# Patient Record
Sex: Female | Born: 1960 | Race: Black or African American | Hispanic: No | Marital: Single | Smoking: Never smoker
Health system: Southern US, Community
[De-identification: ages and names within clinical notes are randomized; demographics above are authoritative.]

## PROBLEM LIST (undated history)

## (undated) DIAGNOSIS — E7800 Pure hypercholesterolemia, unspecified: Secondary | ICD-10-CM

## (undated) DIAGNOSIS — I10 Essential (primary) hypertension: Secondary | ICD-10-CM

## (undated) HISTORY — PX: OTHER SURGICAL HISTORY: SHX169

---

## 2001-02-04 ENCOUNTER — Ambulatory Visit (HOSPITAL_COMMUNITY): Admission: RE | Admit: 2001-02-04 | Discharge: 2001-02-04 | Payer: Self-pay | Admitting: *Deleted

## 2001-02-04 ENCOUNTER — Encounter: Payer: Self-pay | Admitting: *Deleted

## 2001-02-04 ENCOUNTER — Other Ambulatory Visit: Admission: RE | Admit: 2001-02-04 | Discharge: 2001-02-04 | Payer: Self-pay | Admitting: *Deleted

## 2002-04-21 ENCOUNTER — Encounter: Payer: Self-pay | Admitting: Family Medicine

## 2002-04-21 ENCOUNTER — Ambulatory Visit (HOSPITAL_COMMUNITY): Admission: RE | Admit: 2002-04-21 | Discharge: 2002-04-21 | Payer: Self-pay | Admitting: Family Medicine

## 2002-08-04 ENCOUNTER — Emergency Department (HOSPITAL_COMMUNITY): Admission: EM | Admit: 2002-08-04 | Discharge: 2002-08-04 | Payer: Self-pay | Admitting: Emergency Medicine

## 2003-06-25 ENCOUNTER — Ambulatory Visit (HOSPITAL_COMMUNITY): Admission: RE | Admit: 2003-06-25 | Discharge: 2003-06-25 | Payer: Self-pay | Admitting: Family Medicine

## 2004-07-03 ENCOUNTER — Ambulatory Visit (HOSPITAL_COMMUNITY): Admission: RE | Admit: 2004-07-03 | Discharge: 2004-07-03 | Payer: Self-pay | Admitting: Family Medicine

## 2005-03-09 ENCOUNTER — Emergency Department (HOSPITAL_COMMUNITY): Admission: EM | Admit: 2005-03-09 | Discharge: 2005-03-09 | Payer: Self-pay | Admitting: Emergency Medicine

## 2005-07-04 ENCOUNTER — Ambulatory Visit (HOSPITAL_COMMUNITY): Admission: RE | Admit: 2005-07-04 | Discharge: 2005-07-04 | Payer: Self-pay | Admitting: Family Medicine

## 2006-07-11 ENCOUNTER — Ambulatory Visit (HOSPITAL_COMMUNITY): Admission: RE | Admit: 2006-07-11 | Discharge: 2006-07-11 | Payer: Self-pay | Admitting: Family Medicine

## 2007-07-15 ENCOUNTER — Ambulatory Visit (HOSPITAL_COMMUNITY): Admission: RE | Admit: 2007-07-15 | Discharge: 2007-07-15 | Payer: Self-pay | Admitting: Family Medicine

## 2008-07-14 ENCOUNTER — Ambulatory Visit (HOSPITAL_COMMUNITY): Admission: RE | Admit: 2008-07-14 | Discharge: 2008-07-14 | Payer: Self-pay | Admitting: Internal Medicine

## 2009-03-15 ENCOUNTER — Other Ambulatory Visit: Admission: RE | Admit: 2009-03-15 | Discharge: 2009-03-15 | Payer: Self-pay | Admitting: Obstetrics & Gynecology

## 2009-07-22 ENCOUNTER — Ambulatory Visit (HOSPITAL_COMMUNITY): Admission: RE | Admit: 2009-07-22 | Discharge: 2009-07-22 | Payer: Self-pay | Admitting: Family Medicine

## 2009-08-31 ENCOUNTER — Ambulatory Visit (HOSPITAL_COMMUNITY): Payer: Self-pay | Admitting: Oncology

## 2009-08-31 ENCOUNTER — Encounter (HOSPITAL_COMMUNITY)
Admission: RE | Admit: 2009-08-31 | Discharge: 2009-09-30 | Payer: Self-pay | Source: Home / Self Care | Admitting: Oncology

## 2010-05-03 ENCOUNTER — Encounter (HOSPITAL_COMMUNITY): Admission: RE | Admit: 2010-05-03 | Payer: Self-pay | Admitting: Oncology

## 2010-08-06 ENCOUNTER — Encounter: Payer: Self-pay | Admitting: Family Medicine

## 2010-08-10 ENCOUNTER — Other Ambulatory Visit (HOSPITAL_COMMUNITY): Payer: Self-pay | Admitting: Family Medicine

## 2010-08-10 DIAGNOSIS — Z139 Encounter for screening, unspecified: Secondary | ICD-10-CM

## 2010-08-14 ENCOUNTER — Ambulatory Visit (HOSPITAL_COMMUNITY)
Admission: RE | Admit: 2010-08-14 | Discharge: 2010-08-14 | Payer: Self-pay | Source: Home / Self Care | Attending: Family Medicine | Admitting: Family Medicine

## 2010-10-04 LAB — DIFFERENTIAL
Basophils Relative: 1 % (ref 0–1)
Eosinophils Absolute: 0 10*3/uL (ref 0.0–0.7)
Eosinophils Relative: 1 % (ref 0–5)
Monocytes Absolute: 0.5 10*3/uL (ref 0.1–1.0)
Monocytes Relative: 11 % (ref 3–12)
Neutro Abs: 2.4 10*3/uL (ref 1.7–7.7)
Neutrophils Relative %: 49 % (ref 43–77)

## 2010-10-04 LAB — CBC
MCV: 91.7 fL (ref 78.0–100.0)
RBC: 4.17 MIL/uL (ref 3.87–5.11)
RDW: 15.1 % (ref 11.5–15.5)
WBC: 4.9 10*3/uL (ref 4.0–10.5)

## 2011-01-30 ENCOUNTER — Telehealth: Payer: Self-pay | Admitting: Family Medicine

## 2011-01-30 NOTE — Telephone Encounter (Signed)
Sheralyn Boatman from [facility] scheduling called to say that the order you put in is not the order that they need for the hepatotbiliary scan. She put the correct order in epic for you and just needs [place] to cancel the one you put in. They are always done with ejection fraction to eval the GB. Please cancel the order you put in. Thanks.

## 2011-01-30 NOTE — Telephone Encounter (Signed)
It appears as if this was already cancelled when i review chart.

## 2011-08-16 ENCOUNTER — Ambulatory Visit (HOSPITAL_COMMUNITY)
Admission: RE | Admit: 2011-08-16 | Discharge: 2011-08-16 | Disposition: A | Discharge status: Home / Self Care | Payer: BC Managed Care – PPO | Source: Ambulatory Visit | Attending: Family Medicine | Admitting: Family Medicine

## 2011-08-16 DIAGNOSIS — Z139 Encounter for screening, unspecified: Secondary | ICD-10-CM

## 2011-08-16 DIAGNOSIS — Z1231 Encounter for screening mammogram for malignant neoplasm of breast: Secondary | ICD-10-CM | POA: Insufficient documentation

## 2012-04-04 NOTE — H&P (Signed)
  NTS SOAP Note  Vital Signs:  Vitals as of: 03/25/2012: Systolic 149: Diastolic 87: Heart Rate 69: Temp 97.41F: Height 36ft 8in: Weight 207Lbs 0 Ounces: BMI 31  BMI : 31.47 kg/m2  Subjective: This 51 Years 44 Months old Female presents for screening TCS.  Never has had one.  Mother and brother both have colon cancer.  Denies any gi complaints.  Review of Symptoms:  Constitutional:unremarkable   Head:unremarkable    Eyes:unremarkable   Nose/Mouth/Throat:unremarkable Cardiovascular:  unremarkable   Respiratory:unremarkable   Gastrointestinal:  unremarkable   Genitourinary:unremarkable     Musculoskeletal:unremarkable   Skin:unremarkable Hematolgic/Lymphatic:unremarkable     Allergic/Immunologic:unremarkable     Past Medical History:    Reviewed   Past Medical History  Surgical History: none Medical Problems:  High Blood pressure, High cholesterol Allergies: nkda Medications: unknown   Social History:Reviewed  Social History  Preferred Language: English ([place]) Race:  Black or African American Ethnicity: Not Hispanic / Latino Age: 51 Years 8 Months Marital Status:  S Alcohol:  No Recreational drug(s):  No   Smoking Status: Never smoker reviewed on 03/25/2012  Family History:  Reviewed   Family History  Is there a family history ON:GEXBMW h/o colon cancer in mother and brother    Objective Information: General:  Well appearing, well nourished in no distress. Head:Atraumatic; no masses; no abnormalities Heart:  RRR, no murmur Lungs:    CTA bilaterally, no wheezes, rhonchi, rales.  Breathing unlabored. Abdomen:Soft, NT/ND, no HSM, no masses.   deferred to procedure  Assessment:Need for screening TCS, high risk  Diagnosis &amp; Procedure:    Plan:Will call to schedule TCS.   Patient Education:Alternative treatments to surgery were discussed with patient (and family).  Risks  and benefits  of procedure were fully explained to the patient (and family) who gave informed consent. Patient/family questions were addressed.  Follow-up:Pending Surgery

## 2012-04-09 ENCOUNTER — Encounter (HOSPITAL_COMMUNITY): Payer: Self-pay | Admitting: Pharmacy Technician

## 2012-04-14 MED ORDER — SODIUM CHLORIDE 0.45 % IV SOLN
INTRAVENOUS | Status: DC
  Administered 2012-04-15: 08:00:00 via INTRAVENOUS

## 2012-04-14 MED ORDER — SODIUM CHLORIDE 0.9 % IV SOLN: INTRAVENOUS | Status: DC

## 2012-04-15 ENCOUNTER — Ambulatory Visit (HOSPITAL_COMMUNITY)
Admission: RE | Admit: 2012-04-15 | Discharge: 2012-04-15 | Disposition: A | Discharge status: Home / Self Care | Payer: BC Managed Care – PPO | Source: Ambulatory Visit | Attending: General Surgery | Admitting: General Surgery

## 2012-04-15 ENCOUNTER — Encounter (HOSPITAL_COMMUNITY)
Admission: RE | Disposition: A | Discharge status: Home / Self Care | Payer: Self-pay | Source: Ambulatory Visit | Attending: General Surgery

## 2012-04-15 ENCOUNTER — Encounter (HOSPITAL_COMMUNITY): Payer: Self-pay | Admitting: *Deleted

## 2012-04-15 DIAGNOSIS — Z1211 Encounter for screening for malignant neoplasm of colon: Secondary | ICD-10-CM | POA: Insufficient documentation

## 2012-04-15 DIAGNOSIS — E7800 Pure hypercholesterolemia, unspecified: Secondary | ICD-10-CM | POA: Insufficient documentation

## 2012-04-15 DIAGNOSIS — I10 Essential (primary) hypertension: Secondary | ICD-10-CM | POA: Insufficient documentation

## 2012-04-15 DIAGNOSIS — Z01812 Encounter for preprocedural laboratory examination: Secondary | ICD-10-CM | POA: Insufficient documentation

## 2012-04-15 HISTORY — PX: COLONOSCOPY: SHX5424

## 2012-04-15 HISTORY — DX: Essential (primary) hypertension: I10

## 2012-04-15 HISTORY — DX: Pure hypercholesterolemia, unspecified: E78.00

## 2012-04-15 SURGERY — COLONOSCOPY
Anesthesia: Moderate Sedation

## 2012-04-15 MED ORDER — STERILE WATER FOR IRRIGATION IR SOLN
Status: DC | PRN
  Administered 2012-04-15: 08:00:00

## 2012-04-15 MED ORDER — MEPERIDINE HCL 50 MG/ML IJ SOLN
INTRAMUSCULAR | Status: AC
  Filled 2012-04-15: qty 2

## 2012-04-15 MED ORDER — MIDAZOLAM HCL 5 MG/5ML IJ SOLN
INTRAMUSCULAR | Status: DC | PRN
  Administered 2012-04-15: 3 mg via INTRAVENOUS

## 2012-04-15 MED ORDER — MEPERIDINE HCL 25 MG/ML IJ SOLN
INTRAMUSCULAR | Status: DC | PRN
  Administered 2012-04-15: 50 mg via INTRAVENOUS

## 2012-04-15 MED ORDER — MIDAZOLAM HCL 5 MG/5ML IJ SOLN
INTRAMUSCULAR | Status: AC
  Filled 2012-04-15: qty 10

## 2012-04-15 NOTE — Op Note (Signed)
[facility] [address] [place], [address]   COLONOSCOPY PROCEDURE REPORT  PATIENT: Penny Saunders, Penny Saunders.  MR#: [id number] BIRTHDATE: Jul 31, 1960 , 50  yrs. old GENDER: Female ENDOSCOPIST: Franky Macho, MD REFERRED NW:GNFAOZH, John PROCEDURE DATE:  04/15/2012 PROCEDURE:   Colonoscopy, screening ASA CLASS:   Class II INDICATIONS:patient's immediate family history of colon cancer. MEDICATIONS: Versed 3 mg IV and Demerol 50 mg IV  DESCRIPTION OF PROCEDURE:   After the risks benefits and alternatives of the procedure were thoroughly explained, informed consent was obtained.  A digital rectal exam revealed no abnormalities of the rectum.   The EC-[id number] ([id number])  endoscope was introduced through the anus and advanced to the cecum, which was identified by both the appendix and ileocecal valve. No adverse events experienced.   The quality of the prep was adequate, using MoviPrep  The instrument was then slowly withdrawn as the colon was fully examined.      COLON FINDINGS: A normal appearing cecum, ileocecal valve, and appendiceal orifice were identified.  The ascending, hepatic flexure, transverse, splenic flexure, descending, sigmoid colon and rectum appeared unremarkable.  No polyps or cancers were seen. Retroflexion was not performed due to a narrow rectal vault. The time to cecum=4 minutes 0 seconds.  Withdrawal time=3 minutes 0 seconds.  The scope was withdrawn and the procedure completed. COMPLICATIONS: There were no complications.  ENDOSCOPIC IMPRESSION: Normal colon  RECOMMENDATIONS: repeat Colonoscopy in 5 years.   eSigned:  Franky Macho, MD 04/15/2012 9:08 AM   cc:

## 2012-04-15 NOTE — Interval H&P Note (Signed)
History and Physical Interval Note:  04/15/2012 8:51 AM  Penny Saunders  has presented today for surgery, with the diagnosis of Family history of colon cancer   The various methods of treatment have been discussed with the patient and family. After consideration of risks, benefits and other options for treatment, the patient has consented to  Procedure(s) (LRB) with comments: COLONOSCOPY (N/A) as a surgical intervention .  The patient's history has been reviewed, patient examined, no change in status, stable for surgery.  I have reviewed the patient's chart and labs.  Questions were answered to the patient's satisfaction.     Franky Macho A

## 2012-04-21 ENCOUNTER — Encounter (HOSPITAL_COMMUNITY): Payer: Self-pay | Admitting: General Surgery

## 2012-08-27 ENCOUNTER — Other Ambulatory Visit (HOSPITAL_COMMUNITY): Payer: Self-pay | Admitting: Family Medicine

## 2012-08-27 DIAGNOSIS — Z139 Encounter for screening, unspecified: Secondary | ICD-10-CM

## 2012-09-01 ENCOUNTER — Ambulatory Visit (HOSPITAL_COMMUNITY): Payer: BC Managed Care – PPO

## 2012-09-01 ENCOUNTER — Ambulatory Visit (HOSPITAL_COMMUNITY)
Admission: RE | Admit: 2012-09-01 | Discharge: 2012-09-01 | Disposition: A | Discharge status: Home / Self Care | Payer: BC Managed Care – PPO | Source: Ambulatory Visit | Attending: Family Medicine | Admitting: Family Medicine

## 2012-09-01 DIAGNOSIS — Z139 Encounter for screening, unspecified: Secondary | ICD-10-CM

## 2012-09-01 DIAGNOSIS — Z1231 Encounter for screening mammogram for malignant neoplasm of breast: Secondary | ICD-10-CM | POA: Insufficient documentation

## 2013-10-29 ENCOUNTER — Other Ambulatory Visit (HOSPITAL_COMMUNITY): Payer: Self-pay | Admitting: Family Medicine

## 2013-10-29 DIAGNOSIS — Z139 Encounter for screening, unspecified: Secondary | ICD-10-CM

## 2013-11-03 ENCOUNTER — Ambulatory Visit (HOSPITAL_COMMUNITY)
Admission: RE | Admit: 2013-11-03 | Discharge: 2013-11-03 | Disposition: A | Discharge status: Home / Self Care | Payer: BC Managed Care – PPO | Source: Ambulatory Visit | Attending: Family Medicine | Admitting: Family Medicine

## 2013-11-03 DIAGNOSIS — Z1211 Encounter for screening for malignant neoplasm of colon: Secondary | ICD-10-CM | POA: Insufficient documentation

## 2013-11-03 DIAGNOSIS — Z139 Encounter for screening, unspecified: Secondary | ICD-10-CM

## 2014-01-14 ENCOUNTER — Other Ambulatory Visit (HOSPITAL_COMMUNITY): Payer: Self-pay | Admitting: Family Medicine

## 2014-01-14 DIAGNOSIS — R946 Abnormal results of thyroid function studies: Secondary | ICD-10-CM

## 2014-01-18 ENCOUNTER — Ambulatory Visit (HOSPITAL_COMMUNITY): Admission: RE | Admit: 2014-01-18 | Payer: BC Managed Care – PPO | Source: Ambulatory Visit

## 2014-01-25 ENCOUNTER — Encounter (HOSPITAL_COMMUNITY): Payer: Self-pay | Admitting: Emergency Medicine

## 2014-01-25 ENCOUNTER — Emergency Department (HOSPITAL_COMMUNITY)
Admission: EM | Admit: 2014-01-25 | Discharge: 2014-01-25 | Disposition: A | Discharge status: Home / Self Care | Payer: BC Managed Care – PPO | Attending: Emergency Medicine | Admitting: Emergency Medicine

## 2014-01-25 ENCOUNTER — Ambulatory Visit (HOSPITAL_COMMUNITY)
Admission: RE | Admit: 2014-01-25 | Discharge: 2014-01-25 | Disposition: A | Discharge status: Home / Self Care | Payer: BC Managed Care – PPO | Source: Ambulatory Visit | Attending: Family Medicine | Admitting: Family Medicine

## 2014-01-25 ENCOUNTER — Emergency Department (HOSPITAL_COMMUNITY): Payer: BC Managed Care – PPO

## 2014-01-25 DIAGNOSIS — H8110 Benign paroxysmal vertigo, unspecified ear: Secondary | ICD-10-CM | POA: Insufficient documentation

## 2014-01-25 DIAGNOSIS — Z79899 Other long term (current) drug therapy: Secondary | ICD-10-CM | POA: Insufficient documentation

## 2014-01-25 DIAGNOSIS — Z7982 Long term (current) use of aspirin: Secondary | ICD-10-CM | POA: Insufficient documentation

## 2014-01-25 DIAGNOSIS — E042 Nontoxic multinodular goiter: Secondary | ICD-10-CM | POA: Insufficient documentation

## 2014-01-25 DIAGNOSIS — I10 Essential (primary) hypertension: Secondary | ICD-10-CM | POA: Insufficient documentation

## 2014-01-25 DIAGNOSIS — E7800 Pure hypercholesterolemia, unspecified: Secondary | ICD-10-CM | POA: Insufficient documentation

## 2014-01-25 DIAGNOSIS — R946 Abnormal results of thyroid function studies: Secondary | ICD-10-CM

## 2014-01-25 DIAGNOSIS — E119 Type 2 diabetes mellitus without complications: Secondary | ICD-10-CM | POA: Insufficient documentation

## 2014-01-25 LAB — CBC WITH DIFFERENTIAL/PLATELET
BASOS ABS: 0 10*3/uL (ref 0.0–0.1)
BASOS PCT: 0 % (ref 0–1)
EOS ABS: 0 10*3/uL (ref 0.0–0.7)
Eosinophils Relative: 1 % (ref 0–5)
HCT: 37.5 % (ref 36.0–46.0)
Hemoglobin: 12 g/dL (ref 12.0–15.0)
Lymphocytes Relative: 28 % (ref 12–46)
Lymphs Abs: 1.6 10*3/uL (ref 0.7–4.0)
MCH: 28.4 pg (ref 26.0–34.0)
MCHC: 32 g/dL (ref 30.0–36.0)
MCV: 88.9 fL (ref 78.0–100.0)
Monocytes Absolute: 0.3 10*3/uL (ref 0.1–1.0)
Monocytes Relative: 5 % (ref 3–12)
NEUTROS PCT: 66 % (ref 43–77)
Neutro Abs: 3.9 10*3/uL (ref 1.7–7.7)
PLATELETS: 384 10*3/uL (ref 150–400)
RBC: 4.22 MIL/uL (ref 3.87–5.11)
RDW: 14.4 % (ref 11.5–15.5)
WBC: 5.9 10*3/uL (ref 4.0–10.5)

## 2014-01-25 LAB — BASIC METABOLIC PANEL
Anion gap: 11 (ref 5–15)
BUN: 13 mg/dL (ref 6–23)
CO2: 25 mEq/L (ref 19–32)
Calcium: 9.6 mg/dL (ref 8.4–10.5)
Chloride: 107 mEq/L (ref 96–112)
Creatinine, Ser: 0.49 mg/dL — ABNORMAL LOW (ref 0.50–1.10)
Glucose, Bld: 141 mg/dL — ABNORMAL HIGH (ref 70–99)
POTASSIUM: 3.8 meq/L (ref 3.7–5.3)
SODIUM: 143 meq/L (ref 137–147)

## 2014-01-25 LAB — CBG MONITORING, ED: GLUCOSE-CAPILLARY: 148 mg/dL — AB (ref 70–99)

## 2014-01-25 LAB — URINALYSIS, ROUTINE W REFLEX MICROSCOPIC
BILIRUBIN URINE: NEGATIVE
Glucose, UA: NEGATIVE mg/dL
Leukocytes, UA: NEGATIVE
NITRITE: NEGATIVE
UROBILINOGEN UA: 1 mg/dL (ref 0.0–1.0)
pH: 5.5 (ref 5.0–8.0)

## 2014-01-25 LAB — URINE MICROSCOPIC-ADD ON

## 2014-01-25 MED ORDER — MECLIZINE HCL 12.5 MG PO TABS
25.0000 mg | ORAL_TABLET | Freq: Once | ORAL | Status: AC
  Administered 2014-01-25: 25 mg via ORAL
  Filled 2014-01-25: qty 2

## 2014-01-25 MED ORDER — MECLIZINE HCL 50 MG PO TABS: 50.0000 mg | ORAL_TABLET | Freq: Three times a day (TID) | ORAL | Status: DC | PRN

## 2014-01-25 NOTE — Discharge Instructions (Signed)
The MR of your brain is negative for stroke or bleed, mass or tumor. Your complete blood count and your basic metabolic chemistries are within normal limits with exception of your glucose being slightly elevated at 148. Suspect that your dizziness/lightheadedness is related to an inner ear problem. Please increase your water and fluids. Please change positions slowly. Please use Antivert every 6 hours or 3 times daily to assist with the lightheadedness. This medication may cause drowsiness, please use caution in changing positions or getting up and down. Please see your primary physician for additional followup, return to the emergency department if any emergent changes, problems, or concerns. Benign Positional Vertigo Vertigo means you feel like you or your surroundings are moving when they are not. Benign positional vertigo is the most common form of vertigo. Benign means that the cause of your condition is not serious. Benign positional vertigo is more common in older adults. CAUSES  Benign positional vertigo is the result of an upset in the labyrinth system. This is an area in the middle ear that helps control your balance. This may be caused by a viral infection, head injury, or repetitive motion. However, often no specific cause is found. SYMPTOMS  Symptoms of benign positional vertigo occur when you move your head or eyes in different directions. Some of the symptoms may include:  Loss of balance and falls.  Vomiting.  Blurred vision.  Dizziness.  Nausea.  Involuntary eye movements (nystagmus). DIAGNOSIS  Benign positional vertigo is usually diagnosed by physical exam. If the specific cause of your benign positional vertigo is unknown, your caregiver may perform imaging tests, such as magnetic resonance imaging (MRI) or computed tomography (CT). TREATMENT  Your caregiver may recommend movements or procedures to correct the benign positional vertigo. Medicines such as meclizine,  benzodiazepines, and medicines for nausea may be used to treat your symptoms. In rare cases, if your symptoms are caused by certain conditions that affect the inner ear, you may need surgery. HOME CARE INSTRUCTIONS   Follow your caregiver's instructions.  Move slowly. Do not make sudden body or head movements.  Avoid driving.  Avoid operating heavy machinery.  Avoid performing any tasks that would be dangerous to you or others during a vertigo episode.  Drink enough fluids to keep your urine clear or pale yellow. SEEK IMMEDIATE MEDICAL CARE IF:   You develop problems with walking, weakness, numbness, or using your arms, hands, or legs.  You have difficulty speaking.  You develop severe headaches.  Your nausea or vomiting continues or gets worse.  You develop visual changes.  Your family or friends notice any behavioral changes.  Your condition gets worse.  You have a fever.  You develop a stiff neck or sensitivity to light. MAKE SURE YOU:   Understand these instructions.  Will watch your condition.  Will get help right away if you are not doing well or get worse. Document Released: 04/09/2006 Document Revised: 09/24/2011 Document Reviewed: 03/22/2011 [facility] Patient Information 2015 [place]Care, [place]. This information is not intended to replace advice given to you by your health care provider. Make sure you discuss any questions you have with your health care provider.

## 2014-01-25 NOTE — ED Provider Notes (Signed)
CSN: [id number]     Arrival date & time 01/25/14  0804 History   First MD Initiated Contact with Patient 01/25/14 [phone]     No chief complaint on file.    (Consider location/radiation/quality/duration/timing/severity/associated sxs/prior Treatment) HPI Comments: Pt is a 53 y/o female who presents to the ED with c/o "being off balance and dizzy". This problem started yesterday. Pt felt ok at work yesterday, but noted mild dizziness last night at bed time. Problem worse this AM. States she did not feel she could drive. She describes the dizziness as being off balance and feeling as if she may fall. She checked her blood sugars. They were 127 to 140. She denies missing any diabetic medication. No hx of injury or trauma. No sinus congestion. She had problem with dizziness with her blood sugar was low a year ago, no problem since that time.  The history is provided by the patient.    Past Medical History  Diagnosis Date  . Diabetes mellitus   . Hypertension   . Hypercholesteremia    Past Surgical History  Procedure Laterality Date  . Left breast cystectomy    . Colonoscopy  04/15/2012    Procedure: COLONOSCOPY;  Surgeon: Dalia HeadingMark A Jenkins, MD;  Location: [facility];  Service: Gastroenterology;  Laterality: N/A;   Family History  Problem Relation Age of Onset  . Colon cancer Mother    History  Substance Use Topics  . Smoking status: Never Smoker   . Smokeless tobacco: Not on file  . Alcohol Use: No   OB History   Grav Para Term Preterm Abortions TAB SAB Ect Mult Living                 Review of Systems  Constitutional: Negative for activity change.       All ROS Neg except as noted in HPI  HENT: Negative for congestion, ear pain, hearing loss, postnasal drip and sinus pressure.   Eyes: Negative for photophobia and discharge.  Respiratory: Negative for cough, shortness of breath and wheezing.   Cardiovascular: Negative for chest pain and palpitations.  Gastrointestinal: Negative  for abdominal pain and blood in stool.  Genitourinary: Negative for dysuria, frequency and hematuria.  Musculoskeletal: Negative for arthralgias, back pain and neck pain.  Skin: Negative.   Neurological: Positive for dizziness and light-headedness. Negative for seizures and speech difficulty.  Psychiatric/Behavioral: Negative for hallucinations and confusion.      Allergies  Review of patient's allergies indicates no known allergies.  Home Medications   Prior to Admission medications   Medication Sig Start Date End Date Taking? Authorizing Provider  Aspirin (ANACIN PO) Take 2 tablets by mouth 2 (two) times daily as needed. Pain.    Historical Provider, MD  pioglitazone (ACTOS) 30 MG tablet Take 30 mg by mouth daily.    Historical Provider, MD  ramipril (ALTACE) 10 MG capsule Take 10 mg by mouth daily.    Historical Provider, MD  simvastatin (ZOCOR) 40 MG tablet Take 40 mg by mouth every evening.    Historical Provider, MD  sitaGLIPtan-metformin (JANUMET) 50-1000 MG per tablet Take 1 tablet by mouth 2 (two) times daily with a meal.    Historical Provider, MD   LMP 08/01/2012 Physical Exam  Nursing note and vitals reviewed. Constitutional: She is oriented to person, place, and time. She appears well-developed and well-nourished.  Non-toxic appearance.  HENT:  Head: Normocephalic.  Right Ear: Tympanic membrane and external ear normal.  Left Ear: Tympanic membrane and  external ear normal.  Eyes: EOM and lids are normal. Pupils are equal, round, and reactive to light.  No nystagmus  Neck: Normal range of motion. Neck supple. Carotid bruit is not present.  No carotid bruits  Cardiovascular: Normal rate, regular rhythm, normal heart sounds, intact distal pulses and normal pulses.   Pulmonary/Chest: Breath sounds normal. No respiratory distress.  Abdominal: Soft. Bowel sounds are normal. There is no tenderness. There is no guarding.  Musculoskeletal: Normal range of motion.   Lymphadenopathy:       Head (right side): No submandibular adenopathy present.       Head (left side): No submandibular adenopathy present.    She has no cervical adenopathy.  Neurological: She is alert and oriented to person, place, and time. She has normal strength. No cranial nerve deficit or sensory deficit.  I could not recreate sensation of dizziness with change of position or even a short walk. However patient walks in the hallway she has abnormal gait and complained of being off balance and feeling as if she will fall.  Skin: Skin is warm and dry.  Psychiatric: She has a normal mood and affect. Her speech is normal and behavior is normal.    ED Course  Procedures (including critical care time) Labs Review Labs Reviewed - No data to display  Imaging Review No results found.   EKG Interpretation None      MDM The vital signs are well within normal limits. There is no evidence for orthostatic blood pressure changes. Complete blood count is well within normal limits. Basic metabolic panel is within normal limits with exception of the glucose being 141.  MR of the brain was read as normal by the radiologist. Suspect benign paroxysmal positional vertigo. Patient is to follow up with primary physician, or return to the emergency department if any changes, problems, or concerns. Prescription for Antivert was given to the patient with caution and  instructions.    Final diagnoses:  None    **I have reviewed nursing notes, vital signs, and all appropriate lab and imaging results for this patient.Kathie Dike, PA-C 01/25/14 1047

## 2014-01-25 NOTE — ED Notes (Signed)
Lab at bedside

## 2014-01-25 NOTE — ED Notes (Signed)
POC CBG resulted with a value of 148.

## 2014-01-25 NOTE — ED Provider Notes (Signed)
  Face-to-face evaluation   History: She complains of dizziness, gradual in onset, worsening, since yesterday. She denies headache, chest pain, fever, chills, sinus congestion, cough, or shortness of breath. She's never had this previously. There's been no head trauma.  Physical exam: Alert, cooperative. Eyes, ears, and nose and mouth are normal. Heart regular rate and rhythm. No murmur. Lungs clear to auscultation. Neurologic alert and oriented x3 and affect is appropriate. There is no nystagmus, dysarthria or aphasia. Negative head impulse testing, no inducible nystagmus. Gait is ataxic.  Neck- no meningismus.   Medical screening examination/treatment/procedure(s) were conducted as a shared visit with non-physician practitioner(s) and myself.  I personally evaluated the patient during the encounter  Flint MelterElliott L Gwenivere Hiraldo, MD 01/25/14 1705

## 2014-01-25 NOTE — ED Notes (Signed)
Pt not able to void at this time. Pt reports voided prior to arrival. Will attempt to obtain urine sample at later time. nad noted.

## 2014-01-25 NOTE — ED Notes (Signed)
Pt reports dizziness that started yesterday per pt. Pt reports unable to keep balance when walking. Grips equals. Face symmetrical. A/o x3.

## 2014-05-20 ENCOUNTER — Other Ambulatory Visit (HOSPITAL_COMMUNITY): Payer: Self-pay | Admitting: "Endocrinology

## 2014-05-20 DIAGNOSIS — E041 Nontoxic single thyroid nodule: Secondary | ICD-10-CM

## 2014-06-02 ENCOUNTER — Other Ambulatory Visit (HOSPITAL_COMMUNITY): Payer: Self-pay | Admitting: "Endocrinology

## 2014-06-02 ENCOUNTER — Ambulatory Visit (HOSPITAL_COMMUNITY)
Admission: RE | Admit: 2014-06-02 | Discharge: 2014-06-02 | Disposition: A | Discharge status: Home / Self Care | Payer: BC Managed Care – PPO | Source: Ambulatory Visit | Attending: "Endocrinology | Admitting: "Endocrinology

## 2014-06-02 ENCOUNTER — Encounter (HOSPITAL_COMMUNITY): Payer: Self-pay

## 2014-06-02 DIAGNOSIS — E042 Nontoxic multinodular goiter: Secondary | ICD-10-CM | POA: Diagnosis not present

## 2014-06-02 DIAGNOSIS — E041 Nontoxic single thyroid nodule: Secondary | ICD-10-CM

## 2014-06-02 MED ORDER — LIDOCAINE HCL (PF) 2 % IJ SOLN
INTRAMUSCULAR | Status: AC
  Administered 2014-06-02: 200 mg
  Filled 2014-06-02: qty 10

## 2014-06-02 NOTE — Discharge Instructions (Signed)
Thyroid Biopsy °The thyroid gland is a butterfly-shaped gland situated in the front of the neck. It produces hormones which affect metabolism, growth and development, and body temperature. A thyroid biopsy is a procedure in which small samples of tissue or fluid are removed from the thyroid gland or mass and examined under a microscope. This test is done to determine the cause of thyroid problems, such as infection, cancer, or other thyroid problems. °There are 2 ways to obtain samples: °1. Fine needle biopsy. Samples are removed using a thin needle inserted through the skin and into the thyroid gland or mass. °2. Open biopsy. Samples are removed after a cut (incision) is made through the skin. °LET YOUR CAREGIVER KNOW ABOUT:  °· Allergies. °· Medications taken including herbs, eye drops, over-the-counter medications, and creams. °· Use of steroids (by mouth or creams). °· Previous problems with anesthetics or numbing medicine. °· Possibility of pregnancy, if this applies. °· History of blood clots (thrombophlebitis). °· History of bleeding or blood problems. °· Previous surgery. °· Other health problems. °RISKS AND COMPLICATIONS °· Bleeding from the site. The risk of bleeding is higher if you have a bleeding disorder or are taking any blood thinning medications (anticoagulants). °· Infection. °· Injury to structures near the thyroid gland. °BEFORE THE PROCEDURE  °This is a procedure that can be done as an outpatient. Confirm the time that you need to arrive for your procedure. Confirm whether there is a need to fast or withhold any medications. A blood sample may be done to determine your blood clotting time. Medicine may be given to help you relax (sedative). °PROCEDURE °Fine needle biopsy. °You will be awake during the procedure. You may be asked to lie on your back with your head tipped backward to extend your neck. Let your caregiver know if you cannot tolerate the positioning. An area on your neck will be  cleansed. A needle is inserted through the skin of your neck. You may feel a mild discomfort during this procedure. You may be asked to avoid coughing, talking, swallowing, or making sounds during some portions of the procedure. The needle is withdrawn once tissue or fluid samples have been removed. Pressure may be applied to the neck to reduce swelling and ensure that bleeding has stopped. The samples will be sent for examination.  °Open biopsy. °You will be given general anesthesia. You will be asleep during the procedure. An incision is made in your neck. A sample of thyroid tissue or the mass is removed. The tissue sample or mass will be sent for examination. The sample or mass may be examined during the biopsy. If the sample or mass contains cancer cells, some or all of the thyroid gland may be removed. The incision is closed with stitches. °AFTER THE PROCEDURE  °Your recovery will be assessed and monitored. If there are no problems, as an outpatient, you should be able to go home shortly after the procedure. °If you had a fine needle biopsy: °· You may have soreness at the biopsy site for 1 to 2 days. °If you had an open biopsy:  °· You may have soreness at the biopsy site for 3 to 4 days. °· You may have a hoarse voice or sore throat for 1 to 2 days. °Obtaining the Test Results °It is your responsibility to obtain your test results. Do not assume everything is normal if you have not heard from your caregiver or the medical facility. It is important for you to follow up   on all of your test results. °HOME CARE INSTRUCTIONS  °· Keeping your head raised on a pillow when you are lying down may ease biopsy site discomfort. °· Supporting the back of your head and neck with both hands as you sit up from a lying position may ease biopsy site discomfort. °· Only take over-the-counter or prescription medicines for pain, discomfort, or fever as directed by your caregiver. °· Throat lozenges or gargling with warm salt  water may help to soothe a sore throat. °SEEK IMMEDIATE MEDICAL CARE IF:  °· You have severe bleeding from the biopsy site. °· You have difficulty swallowing. °· You have a fever. °· You have increased pain, swelling, redness, or warmth at the biopsy site. °· You notice pus coming from the biopsy site. °· You have swollen glands (lymph nodes) in your neck. °Document Released: 04/29/2007 Document Revised: 10/27/2012 Document Reviewed: 09/24/2013 °ExitCare® Patient Information ©2015 Exit[employer], [employer]. This information is not intended to replace advice given to you by your health care provider. Make sure you discuss any questions you have with your health care provider. ° °

## 2014-12-24 ENCOUNTER — Other Ambulatory Visit (HOSPITAL_COMMUNITY): Payer: Self-pay | Admitting: Family Medicine

## 2014-12-24 DIAGNOSIS — Z1231 Encounter for screening mammogram for malignant neoplasm of breast: Secondary | ICD-10-CM

## 2014-12-29 ENCOUNTER — Ambulatory Visit (HOSPITAL_COMMUNITY)
Admission: RE | Admit: 2014-12-29 | Discharge: 2014-12-29 | Disposition: A | Discharge status: Home / Self Care | Payer: BLUE CROSS/BLUE SHIELD | Source: Ambulatory Visit | Attending: Family Medicine | Admitting: Family Medicine

## 2014-12-29 DIAGNOSIS — Z1231 Encounter for screening mammogram for malignant neoplasm of breast: Secondary | ICD-10-CM | POA: Insufficient documentation

## 2015-06-16 ENCOUNTER — Ambulatory Visit: Payer: Self-pay | Admitting: "Endocrinology

## 2015-12-27 ENCOUNTER — Other Ambulatory Visit (HOSPITAL_COMMUNITY): Payer: Self-pay | Admitting: Family Medicine

## 2015-12-27 DIAGNOSIS — Z1231 Encounter for screening mammogram for malignant neoplasm of breast: Secondary | ICD-10-CM

## 2016-01-05 ENCOUNTER — Ambulatory Visit (HOSPITAL_COMMUNITY)
Admission: RE | Admit: 2016-01-05 | Discharge: 2016-01-05 | Disposition: A | Discharge status: Home / Self Care | Payer: BLUE CROSS/BLUE SHIELD | Source: Ambulatory Visit | Attending: Family Medicine | Admitting: Family Medicine

## 2016-01-05 DIAGNOSIS — Z1231 Encounter for screening mammogram for malignant neoplasm of breast: Secondary | ICD-10-CM | POA: Diagnosis present

## 2017-01-03 ENCOUNTER — Other Ambulatory Visit (HOSPITAL_COMMUNITY): Payer: Self-pay | Admitting: Family Medicine

## 2017-01-03 DIAGNOSIS — Z1231 Encounter for screening mammogram for malignant neoplasm of breast: Secondary | ICD-10-CM

## 2017-01-07 ENCOUNTER — Ambulatory Visit (HOSPITAL_COMMUNITY)
Admission: RE | Admit: 2017-01-07 | Discharge: 2017-01-07 | Disposition: A | Discharge status: Home / Self Care | Payer: BLUE CROSS/BLUE SHIELD | Source: Ambulatory Visit | Attending: Family Medicine | Admitting: Family Medicine

## 2017-01-07 DIAGNOSIS — Z1231 Encounter for screening mammogram for malignant neoplasm of breast: Secondary | ICD-10-CM | POA: Insufficient documentation

## 2018-01-01 ENCOUNTER — Ambulatory Visit: Payer: BLUE CROSS/BLUE SHIELD | Admitting: Orthopaedic Surgery

## 2018-01-01 ENCOUNTER — Encounter: Payer: Self-pay | Admitting: Orthopaedic Surgery

## 2018-01-01 VITALS — BP 128/80 | HR 75 | Ht 67.0 in | Wt 208.0 lb

## 2018-01-01 DIAGNOSIS — M65311 Trigger thumb, right thumb: Secondary | ICD-10-CM

## 2018-01-01 NOTE — Progress Notes (Signed)
Subjective:    Patient ID: Penny Saunders, female    DOB: [birth year], 57 y.o.   MRN: [id number]  HPI She has had popping and locking of the right dominant thumb for about three weeks and it is not getting any better.  She has no trauma, no numbness, no redness.  Nothing seems to help it.  She is bothered as it happens so often,.  She has no other joint or finger problem.   Review of Systems  Musculoskeletal: Positive for arthralgias.  All other systems reviewed and are negative.  Past Medical History:  Diagnosis Date  . Diabetes mellitus   . Hypercholesteremia   . Hypertension     Past Surgical History:  Procedure Laterality Date  . COLONOSCOPY  04/15/2012   Procedure: COLONOSCOPY;  Surgeon: Dalia Heading, MD;  Location: [facility];  Service: Gastroenterology;  Laterality: N/A;  . left breast cystectomy      Current Outpatient Medications on File Prior to Visit  Medication Sig Dispense Refill  . aspirin EC 81 MG tablet Take 81 mg by mouth daily.    . meclizine (ANTIVERT) 50 MG tablet Take 1 tablet (50 mg total) by mouth 3 (three) times daily as needed. 30 tablet 0  . ramipril (ALTACE) 10 MG capsule Take 10 mg by mouth daily.    . simvastatin (ZOCOR) 40 MG tablet Take 40 mg by mouth every evening.    . sitaGLIPtan-metformin (JANUMET) 50-1000 MG per tablet Take 1 tablet by mouth 2 (two) times daily with a meal.     No current facility-administered medications on file prior to visit.     Social History   Socioeconomic History  . Marital status: Single    Spouse name: Not on file  . Number of children: Not on file  . Years of education: Not on file  . Highest education level: Not on file  Occupational History  . Not on file  Social Needs  . Financial resource strain: Not on file  . Food insecurity:    Worry: Not on file    Inability: Not on file  . Transportation needs:    Medical: Not on file    Non-medical: Not on file  Tobacco Use  . Smoking status:  Never Smoker  . Smokeless tobacco: Never Used  Substance and Sexual Activity  . Alcohol use: No  . Drug use: No  . Sexual activity: Not on file  Lifestyle  . Physical activity:    Days per week: Not on file    Minutes per session: Not on file  . Stress: Not on file  Relationships  . Social connections:    Talks on phone: Not on file    Gets together: Not on file    Attends religious service: Not on file    Active member of club or organization: Not on file    Attends meetings of clubs or organizations: Not on file    Relationship status: Not on file  . Intimate partner violence:    Fear of current or ex partner: Not on file    Emotionally abused: Not on file    Physically abused: Not on file    Forced sexual activity: Not on file  Other Topics Concern  . Not on file  Social History Narrative  . Not on file    Family History  Problem Relation Age of Onset  . Colon cancer Mother   . Heart disease Mother   . Colon cancer  Brother     BP 128/80   Pulse 75   Ht 5\' 7"  (1.702 m)   Wt 208 lb (94.3 kg)   LMP 08/01/2012   BMI 32.58 kg/m      Objective:   Physical Exam  Constitutional: She is oriented to person, place, and time. She appears well-developed and well-nourished.  HENT:  Head: Normocephalic and atraumatic.  Eyes: Pupils are equal, round, and reactive to light. Conjunctivae and EOM are normal.  Neck: Normal range of motion. Neck supple.  Cardiovascular: Normal rate, regular rhythm and intact distal pulses.  Pulmonary/Chest: Effort normal.  Abdominal: Soft.  Musculoskeletal:       Hands: Neurological: She is alert and oriented to person, place, and time. She has normal reflexes. She displays normal reflexes. No cranial nerve deficit. She exhibits normal muscle tone. Coordination normal.  Skin: Skin is warm and dry.  Psychiatric: She has a normal mood and affect. Her behavior is normal. Judgment and thought content normal.          Assessment & Plan:    Encounter Diagnosis  Name Primary?  . Trigger thumb of right hand Yes   Procedure note:  After permission from the patient and sterile prep, the area around the A1 pulley of the right thumb was injected with 1% plain Xylocaine and 1 cc of DepoMedrol 40 by sterile technique tolerated well.  I have told her about possible surgery.  I will see her in two weeks.  Call if any problem.  Precautions discussed.   Electronically Signed Darreld McleanWayne Elisea Khader, MD 6/19/20198:43 AM

## 2018-01-06 ENCOUNTER — Other Ambulatory Visit (HOSPITAL_COMMUNITY): Payer: Self-pay | Admitting: Family Medicine

## 2018-01-06 DIAGNOSIS — Z1231 Encounter for screening mammogram for malignant neoplasm of breast: Secondary | ICD-10-CM

## 2018-01-09 ENCOUNTER — Ambulatory Visit (HOSPITAL_COMMUNITY)
Admission: RE | Admit: 2018-01-09 | Discharge: 2018-01-09 | Disposition: A | Discharge status: Home / Self Care | Payer: BLUE CROSS/BLUE SHIELD | Source: Ambulatory Visit | Attending: Family Medicine | Admitting: Family Medicine

## 2018-01-09 DIAGNOSIS — Z1231 Encounter for screening mammogram for malignant neoplasm of breast: Secondary | ICD-10-CM

## 2018-01-14 ENCOUNTER — Other Ambulatory Visit (HOSPITAL_COMMUNITY): Payer: Self-pay | Admitting: Family Medicine

## 2018-01-14 DIAGNOSIS — R928 Other abnormal and inconclusive findings on diagnostic imaging of breast: Secondary | ICD-10-CM

## 2018-01-15 ENCOUNTER — Encounter: Payer: Self-pay | Admitting: Orthopaedic Surgery

## 2018-01-15 ENCOUNTER — Ambulatory Visit (INDEPENDENT_AMBULATORY_CARE_PROVIDER_SITE_OTHER): Payer: BLUE CROSS/BLUE SHIELD | Admitting: Orthopaedic Surgery

## 2018-01-15 VITALS — BP 105/64 | HR 64 | Ht 67.0 in | Wt 204.0 lb

## 2018-01-15 DIAGNOSIS — M65311 Trigger thumb, right thumb: Secondary | ICD-10-CM | POA: Diagnosis not present

## 2018-01-15 NOTE — Progress Notes (Signed)
CC:  My thumb is much better  She has had good results from the injection last time and has no further locking of the right thumb.  She has no pain.  NV intact.  ROM of the right thumb is full.  Encounter Diagnosis  Name Primary?  . Trigger thumb of right hand Yes   I will see as needed.  She may need surgery if it recurs.  Call if any problem.  Precautions discussed.   Electronically Signed Darreld McleanWayne Omere Marti, MD 7/3/201910:20 AM

## 2018-01-28 ENCOUNTER — Ambulatory Visit (HOSPITAL_COMMUNITY)
Admission: RE | Admit: 2018-01-28 | Discharge: 2018-01-28 | Disposition: A | Discharge status: Home / Self Care | Payer: BLUE CROSS/BLUE SHIELD | Source: Ambulatory Visit | Attending: Family Medicine | Admitting: Family Medicine

## 2018-01-28 DIAGNOSIS — R928 Other abnormal and inconclusive findings on diagnostic imaging of breast: Secondary | ICD-10-CM | POA: Diagnosis present

## 2018-02-11 ENCOUNTER — Encounter (HOSPITAL_COMMUNITY): Payer: BLUE CROSS/BLUE SHIELD

## 2019-03-31 ENCOUNTER — Other Ambulatory Visit (HOSPITAL_COMMUNITY): Payer: Self-pay | Admitting: Family Medicine

## 2019-03-31 DIAGNOSIS — Z1231 Encounter for screening mammogram for malignant neoplasm of breast: Secondary | ICD-10-CM

## 2019-04-08 ENCOUNTER — Ambulatory Visit (HOSPITAL_COMMUNITY)
Admission: RE | Admit: 2019-04-08 | Discharge: 2019-04-08 | Disposition: A | Discharge status: Home / Self Care | Payer: BC Managed Care – PPO | Source: Ambulatory Visit | Attending: Family Medicine | Admitting: Family Medicine

## 2019-04-08 ENCOUNTER — Other Ambulatory Visit: Payer: Self-pay

## 2019-04-08 DIAGNOSIS — Z1231 Encounter for screening mammogram for malignant neoplasm of breast: Secondary | ICD-10-CM | POA: Insufficient documentation

## 2020-04-11 ENCOUNTER — Other Ambulatory Visit (HOSPITAL_COMMUNITY): Payer: Self-pay | Admitting: Family Medicine

## 2020-04-11 DIAGNOSIS — Z1231 Encounter for screening mammogram for malignant neoplasm of breast: Secondary | ICD-10-CM

## 2020-04-18 ENCOUNTER — Other Ambulatory Visit: Payer: Self-pay

## 2020-04-18 ENCOUNTER — Ambulatory Visit (HOSPITAL_COMMUNITY)
Admission: RE | Admit: 2020-04-18 | Discharge: 2020-04-18 | Disposition: A | Discharge status: Home / Self Care | Payer: BC Managed Care – PPO | Source: Ambulatory Visit | Attending: Family Medicine | Admitting: Family Medicine

## 2020-04-18 DIAGNOSIS — Z1231 Encounter for screening mammogram for malignant neoplasm of breast: Secondary | ICD-10-CM | POA: Insufficient documentation

## 2021-03-17 DIAGNOSIS — E782 Mixed hyperlipidemia: Secondary | ICD-10-CM | POA: Diagnosis not present

## 2021-03-17 DIAGNOSIS — Z23 Encounter for immunization: Secondary | ICD-10-CM | POA: Diagnosis not present

## 2021-03-17 DIAGNOSIS — E118 Type 2 diabetes mellitus with unspecified complications: Secondary | ICD-10-CM | POA: Diagnosis not present

## 2021-03-17 DIAGNOSIS — Z6832 Body mass index (BMI) 32.0-32.9, adult: Secondary | ICD-10-CM | POA: Diagnosis not present

## 2021-03-17 DIAGNOSIS — E6609 Other obesity due to excess calories: Secondary | ICD-10-CM | POA: Diagnosis not present

## 2021-03-17 DIAGNOSIS — E7849 Other hyperlipidemia: Secondary | ICD-10-CM | POA: Diagnosis not present

## 2021-03-17 DIAGNOSIS — R946 Abnormal results of thyroid function studies: Secondary | ICD-10-CM | POA: Diagnosis not present

## 2021-03-17 DIAGNOSIS — I10 Essential (primary) hypertension: Secondary | ICD-10-CM | POA: Diagnosis not present

## 2021-05-01 ENCOUNTER — Other Ambulatory Visit (HOSPITAL_COMMUNITY): Payer: Self-pay | Admitting: Family Medicine

## 2021-05-01 DIAGNOSIS — Z1231 Encounter for screening mammogram for malignant neoplasm of breast: Secondary | ICD-10-CM

## 2021-05-05 ENCOUNTER — Other Ambulatory Visit: Payer: Self-pay

## 2021-05-05 ENCOUNTER — Ambulatory Visit (HOSPITAL_COMMUNITY)
Admission: RE | Admit: 2021-05-05 | Discharge: 2021-05-05 | Disposition: A | Discharge status: Home / Self Care | Payer: BC Managed Care – PPO | Source: Ambulatory Visit | Attending: Family Medicine | Admitting: Family Medicine

## 2021-05-05 DIAGNOSIS — Z1231 Encounter for screening mammogram for malignant neoplasm of breast: Secondary | ICD-10-CM | POA: Insufficient documentation

## 2021-08-01 DIAGNOSIS — E6609 Other obesity due to excess calories: Secondary | ICD-10-CM | POA: Diagnosis not present

## 2021-08-01 DIAGNOSIS — E118 Type 2 diabetes mellitus with unspecified complications: Secondary | ICD-10-CM | POA: Diagnosis not present

## 2021-08-01 DIAGNOSIS — E7849 Other hyperlipidemia: Secondary | ICD-10-CM | POA: Diagnosis not present

## 2021-08-01 DIAGNOSIS — Z1331 Encounter for screening for depression: Secondary | ICD-10-CM | POA: Diagnosis not present

## 2021-08-01 DIAGNOSIS — R946 Abnormal results of thyroid function studies: Secondary | ICD-10-CM | POA: Diagnosis not present

## 2021-08-01 DIAGNOSIS — E782 Mixed hyperlipidemia: Secondary | ICD-10-CM | POA: Diagnosis not present

## 2021-08-01 DIAGNOSIS — I10 Essential (primary) hypertension: Secondary | ICD-10-CM | POA: Diagnosis not present

## 2021-08-01 DIAGNOSIS — Z6831 Body mass index (BMI) 31.0-31.9, adult: Secondary | ICD-10-CM | POA: Diagnosis not present

## 2021-12-08 DIAGNOSIS — Z6831 Body mass index (BMI) 31.0-31.9, adult: Secondary | ICD-10-CM | POA: Diagnosis not present

## 2021-12-08 DIAGNOSIS — E782 Mixed hyperlipidemia: Secondary | ICD-10-CM | POA: Diagnosis not present

## 2021-12-08 DIAGNOSIS — I10 Essential (primary) hypertension: Secondary | ICD-10-CM | POA: Diagnosis not present

## 2021-12-08 DIAGNOSIS — E6609 Other obesity due to excess calories: Secondary | ICD-10-CM | POA: Diagnosis not present

## 2021-12-08 DIAGNOSIS — R946 Abnormal results of thyroid function studies: Secondary | ICD-10-CM | POA: Diagnosis not present

## 2021-12-08 DIAGNOSIS — M79675 Pain in left toe(s): Secondary | ICD-10-CM | POA: Diagnosis not present

## 2021-12-08 DIAGNOSIS — E118 Type 2 diabetes mellitus with unspecified complications: Secondary | ICD-10-CM | POA: Diagnosis not present

## 2022-03-30 ENCOUNTER — Other Ambulatory Visit (HOSPITAL_COMMUNITY): Payer: Self-pay | Admitting: Family Medicine

## 2022-03-30 DIAGNOSIS — E118 Type 2 diabetes mellitus with unspecified complications: Secondary | ICD-10-CM | POA: Diagnosis not present

## 2022-03-30 DIAGNOSIS — Z6829 Body mass index (BMI) 29.0-29.9, adult: Secondary | ICD-10-CM | POA: Diagnosis not present

## 2022-03-30 DIAGNOSIS — E782 Mixed hyperlipidemia: Secondary | ICD-10-CM | POA: Diagnosis not present

## 2022-03-30 DIAGNOSIS — E7849 Other hyperlipidemia: Secondary | ICD-10-CM | POA: Diagnosis not present

## 2022-03-30 DIAGNOSIS — Z1231 Encounter for screening mammogram for malignant neoplasm of breast: Secondary | ICD-10-CM

## 2022-03-30 DIAGNOSIS — Z1389 Encounter for screening for other disorder: Secondary | ICD-10-CM | POA: Diagnosis not present

## 2022-03-30 DIAGNOSIS — E6609 Other obesity due to excess calories: Secondary | ICD-10-CM | POA: Diagnosis not present

## 2022-03-30 DIAGNOSIS — R946 Abnormal results of thyroid function studies: Secondary | ICD-10-CM | POA: Diagnosis not present

## 2022-03-30 DIAGNOSIS — I10 Essential (primary) hypertension: Secondary | ICD-10-CM | POA: Diagnosis not present

## 2022-03-30 DIAGNOSIS — E119 Type 2 diabetes mellitus without complications: Secondary | ICD-10-CM | POA: Diagnosis not present

## 2022-06-01 ENCOUNTER — Ambulatory Visit (HOSPITAL_COMMUNITY)
Admission: RE | Admit: 2022-06-01 | Discharge: 2022-06-01 | Disposition: A | Discharge status: Home / Self Care | Payer: BC Managed Care – PPO | Source: Ambulatory Visit | Attending: Family Medicine | Admitting: Family Medicine

## 2022-06-01 DIAGNOSIS — Z1231 Encounter for screening mammogram for malignant neoplasm of breast: Secondary | ICD-10-CM | POA: Insufficient documentation

## 2022-08-10 DIAGNOSIS — I10 Essential (primary) hypertension: Secondary | ICD-10-CM | POA: Diagnosis not present

## 2022-08-10 DIAGNOSIS — E7849 Other hyperlipidemia: Secondary | ICD-10-CM | POA: Diagnosis not present

## 2022-08-10 DIAGNOSIS — E118 Type 2 diabetes mellitus with unspecified complications: Secondary | ICD-10-CM | POA: Diagnosis not present

## 2022-08-10 DIAGNOSIS — E6609 Other obesity due to excess calories: Secondary | ICD-10-CM | POA: Diagnosis not present

## 2022-08-10 DIAGNOSIS — Z6830 Body mass index (BMI) 30.0-30.9, adult: Secondary | ICD-10-CM | POA: Diagnosis not present

## 2022-08-10 DIAGNOSIS — E782 Mixed hyperlipidemia: Secondary | ICD-10-CM | POA: Diagnosis not present

## 2022-08-10 DIAGNOSIS — R946 Abnormal results of thyroid function studies: Secondary | ICD-10-CM | POA: Diagnosis not present

## 2022-08-10 DIAGNOSIS — E119 Type 2 diabetes mellitus without complications: Secondary | ICD-10-CM | POA: Diagnosis not present

## 2022-11-16 DIAGNOSIS — E6609 Other obesity due to excess calories: Secondary | ICD-10-CM | POA: Diagnosis not present

## 2022-11-16 DIAGNOSIS — E119 Type 2 diabetes mellitus without complications: Secondary | ICD-10-CM | POA: Diagnosis not present

## 2022-11-16 DIAGNOSIS — Z6828 Body mass index (BMI) 28.0-28.9, adult: Secondary | ICD-10-CM | POA: Diagnosis not present

## 2022-12-05 ENCOUNTER — Other Ambulatory Visit: Payer: Self-pay | Admitting: *Deleted

## 2022-12-05 DIAGNOSIS — Z1211 Encounter for screening for malignant neoplasm of colon: Secondary | ICD-10-CM

## 2022-12-11 ENCOUNTER — Encounter: Payer: Self-pay | Admitting: General Surgery

## 2022-12-11 ENCOUNTER — Ambulatory Visit: Payer: BC Managed Care – PPO | Admitting: General Surgery

## 2022-12-11 VITALS — BP 104/70 | HR 85 | Temp 97.8°F | Resp 12 | Ht 67.0 in | Wt 184.0 lb

## 2022-12-11 DIAGNOSIS — Z1211 Encounter for screening for malignant neoplasm of colon: Secondary | ICD-10-CM

## 2022-12-11 MED ORDER — SUTAB 1479-225-188 MG PO TABS: 24.0000 | ORAL_TABLET | Freq: Once | ORAL | 0 refills | Status: AC

## 2022-12-11 NOTE — H&P (Signed)
Penny Saunders; [id number]; 1961-03-07   HPI Patient is a 62 year old black female who was referred to my care for a screening colonoscopy.  She last had a colonoscopy in 2013 which was unremarkable.  She denies any family history of colon cancer.  She has had no abnormal diarrhea or constipation, blood per rectum, or weight change. Past Medical History:  Diagnosis Date   Diabetes mellitus    Hypercholesteremia    Hypertension     Past Surgical History:  Procedure Laterality Date   COLONOSCOPY  04/15/2012   Procedure: COLONOSCOPY;  Surgeon: Dalia Heading, MD;  Location: [facility];  Service: Gastroenterology;  Laterality: N/A;   left breast cystectomy      Family History  Problem Relation Age of Onset   Colon cancer Mother    Heart disease Mother    Colon cancer Brother     Current Outpatient Medications on File Prior to Visit  Medication Sig Dispense Refill   aspirin EC 81 MG tablet Take 81 mg by mouth daily.     empagliflozin (JARDIANCE) 10 MG TABS tablet Take 10 mg by mouth daily.     metFORMIN (GLUCOPHAGE) 1000 MG tablet Take 1,000 mg by mouth 2 (two) times daily.     ramipril (ALTACE) 10 MG capsule Take 10 mg by mouth daily.     simvastatin (ZOCOR) 40 MG tablet Take 40 mg by mouth every evening.     No current facility-administered medications on file prior to visit.    No Known Allergies  Social History   Substance and Sexual Activity  Alcohol Use No    Social History   Tobacco Use  Smoking Status Never  Smokeless Tobacco Never    Review of Systems  Constitutional: Negative.   HENT: Negative.    Eyes: Negative.   Respiratory: Negative.    Cardiovascular: Negative.   Gastrointestinal: Negative.   Genitourinary: Negative.   Musculoskeletal: Negative.   Skin: Negative.   Endo/Heme/Allergies: Negative.     Objective   Vitals:   12/11/22 1329  BP: 104/70  Pulse: 85  Resp: 12  Temp: 97.8 F (36.6 C)  SpO2: 95%    Physical Exam Vitals  reviewed.  Constitutional:      Appearance: Normal appearance. She is normal weight. She is not ill-appearing or diaphoretic.  HENT:     Head: Normocephalic and atraumatic.  Cardiovascular:     Rate and Rhythm: Normal rate and regular rhythm.     Heart sounds: Normal heart sounds. No murmur heard.    No friction rub. No gallop.  Pulmonary:     Effort: Pulmonary effort is normal. No respiratory distress.     Breath sounds: No stridor. No wheezing, rhonchi or rales.  Abdominal:     General: Bowel sounds are normal. There is no distension.     Palpations: Abdomen is soft. There is no mass.     Tenderness: There is no abdominal tenderness. There is no guarding or rebound.     Hernia: No hernia is present.  Skin:    General: Skin is warm and dry.  Neurological:     Mental Status: She is alert and oriented to person, place, and time.     Assessment  Need for screening colonoscopy Plan  Patient is scheduled for screening colonoscopy on 12/18/2022.  The risks and benefits of the procedure including bleeding and perforation were fully explained to the patient, who gave informed consent.  Sutabs had been prescribed preoperatively for bowel  preparation.

## 2022-12-11 NOTE — Progress Notes (Signed)
Penny Saunders; [id number]; 04/29/1961   HPI Patient is a 62-year-old black female who was referred to my care for a screening colonoscopy.  She last had a colonoscopy in 2013 which was unremarkable.  She denies any family history of colon cancer.  She has had no abnormal diarrhea or constipation, blood per rectum, or weight change. Past Medical History:  Diagnosis Date   Diabetes mellitus    Hypercholesteremia    Hypertension     Past Surgical History:  Procedure Laterality Date   COLONOSCOPY  04/15/2012   Procedure: COLONOSCOPY;  Surgeon: Faline Langer A [name], MD;  Location: [facility];  Service: Gastroenterology;  Laterality: N/A;   left breast cystectomy      Family History  Problem Relation Age of Onset   Colon cancer Mother    Heart disease Mother    Colon cancer Brother     Current Outpatient Medications on File Prior to Visit  Medication Sig Dispense Refill   aspirin EC 81 MG tablet Take 81 mg by mouth daily.     empagliflozin (JARDIANCE) 10 MG TABS tablet Take 10 mg by mouth daily.     metFORMIN (GLUCOPHAGE) 1000 MG tablet Take 1,000 mg by mouth 2 (two) times daily.     ramipril (ALTACE) 10 MG capsule Take 10 mg by mouth daily.     simvastatin (ZOCOR) 40 MG tablet Take 40 mg by mouth every evening.     No current facility-administered medications on file prior to visit.    No Known Allergies  Social History   Substance and Sexual Activity  Alcohol Use No    Social History   Tobacco Use  Smoking Status Never  Smokeless Tobacco Never    Review of Systems  Constitutional: Negative.   HENT: Negative.    Eyes: Negative.   Respiratory: Negative.    Cardiovascular: Negative.   Gastrointestinal: Negative.   Genitourinary: Negative.   Musculoskeletal: Negative.   Skin: Negative.   Endo/Heme/Allergies: Negative.     Objective   Vitals:   12/11/22 1329  BP: 104/70  Pulse: 85  Resp: 12  Temp: 97.8 F (36.6 C)  SpO2: 95%    Physical Exam Vitals  reviewed.  Constitutional:      Appearance: Normal appearance. She is normal weight. She is not ill-appearing or diaphoretic.  HENT:     Head: Normocephalic and atraumatic.  Cardiovascular:     Rate and Rhythm: Normal rate and regular rhythm.     Heart sounds: Normal heart sounds. No murmur heard.    No friction rub. No gallop.  Pulmonary:     Effort: Pulmonary effort is normal. No respiratory distress.     Breath sounds: No stridor. No wheezing, rhonchi or rales.  Abdominal:     General: Bowel sounds are normal. There is no distension.     Palpations: Abdomen is soft. There is no mass.     Tenderness: There is no abdominal tenderness. There is no guarding or rebound.     Hernia: No hernia is present.  Skin:    General: Skin is warm and dry.  Neurological:     Mental Status: She is alert and oriented to person, place, and time.     Assessment  Need for screening colonoscopy Plan  Patient is scheduled for screening colonoscopy on 12/18/2022.  The risks and benefits of the procedure including bleeding and perforation were fully explained to the patient, who gave informed consent.  Sutabs had been prescribed preoperatively for bowel   preparation. 

## 2022-12-18 ENCOUNTER — Ambulatory Visit (HOSPITAL_COMMUNITY)
Admission: RE | Admit: 2022-12-18 | Discharge: 2022-12-18 | Disposition: A | Discharge status: Home / Self Care | Payer: BC Managed Care – PPO | Attending: General Surgery | Admitting: General Surgery

## 2022-12-18 ENCOUNTER — Encounter (HOSPITAL_COMMUNITY): Payer: Self-pay | Admitting: General Surgery

## 2022-12-18 ENCOUNTER — Other Ambulatory Visit: Payer: Self-pay

## 2022-12-18 ENCOUNTER — Encounter (HOSPITAL_COMMUNITY)
Admission: RE | Disposition: A | Discharge status: Home / Self Care | Payer: Self-pay | Source: Home / Self Care | Attending: General Surgery

## 2022-12-18 ENCOUNTER — Ambulatory Visit (HOSPITAL_COMMUNITY): Payer: BC Managed Care – PPO | Admitting: Anesthesiology

## 2022-12-18 DIAGNOSIS — Z1211 Encounter for screening for malignant neoplasm of colon: Secondary | ICD-10-CM | POA: Diagnosis not present

## 2022-12-18 DIAGNOSIS — E119 Type 2 diabetes mellitus without complications: Secondary | ICD-10-CM | POA: Diagnosis not present

## 2022-12-18 DIAGNOSIS — K644 Residual hemorrhoidal skin tags: Secondary | ICD-10-CM | POA: Diagnosis not present

## 2022-12-18 DIAGNOSIS — Z800 Family history of malignant neoplasm of digestive organs: Secondary | ICD-10-CM | POA: Insufficient documentation

## 2022-12-18 HISTORY — PX: COLONOSCOPY WITH PROPOFOL: SHX5780

## 2022-12-18 LAB — GLUCOSE, CAPILLARY: Glucose-Capillary: 118 mg/dL — ABNORMAL HIGH (ref 70–99)

## 2022-12-18 IMAGING — MG MM DIGITAL SCREENING BILAT W/ TOMO AND CAD
6 of 12 series · 6 of 36 positions shown · non-contrast
Comparison: Previous exam(s).

CLINICAL DATA: Screening.

EXAM:
DIGITAL SCREENING BILATERAL MAMMOGRAM WITH TOMOSYNTHESIS AND CAD
TECHNIQUE: Bilateral screening digital craniocaudal and mediolateral oblique
mammograms were obtained. Bilateral screening digital breast
tomosynthesis was performed. The images were evaluated with
computer-aided detection.

[R CC synth-2D]
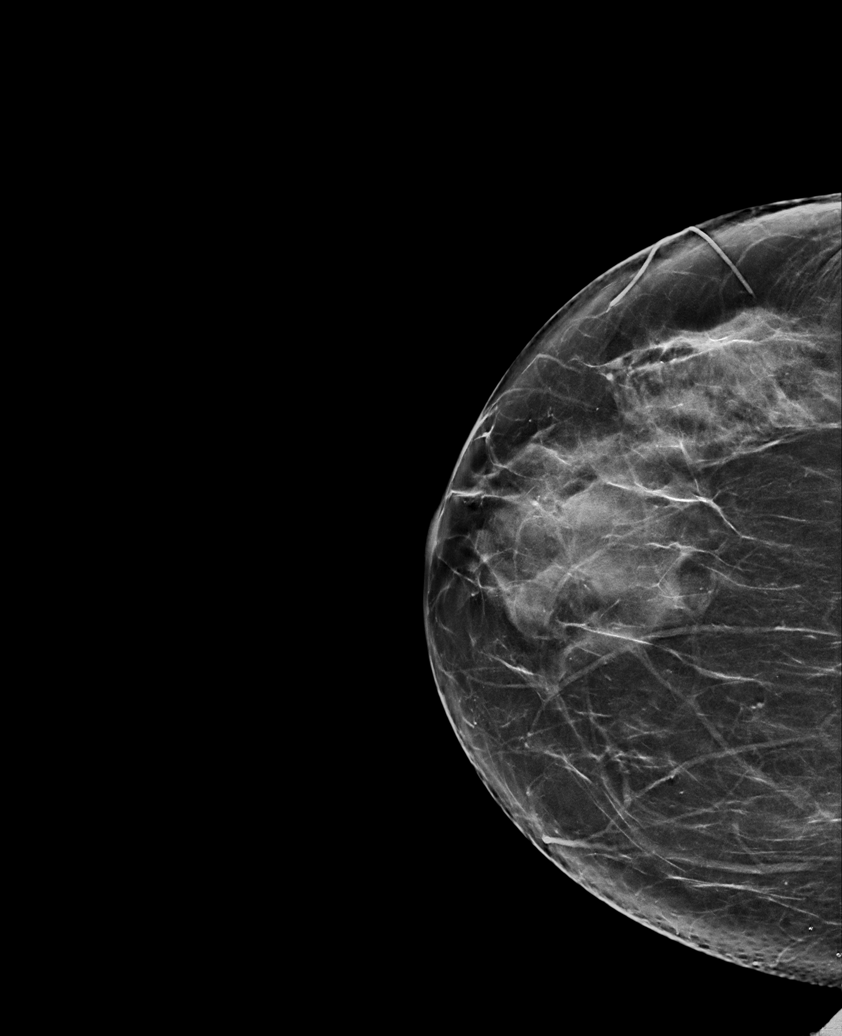

[L CC synth-2D]
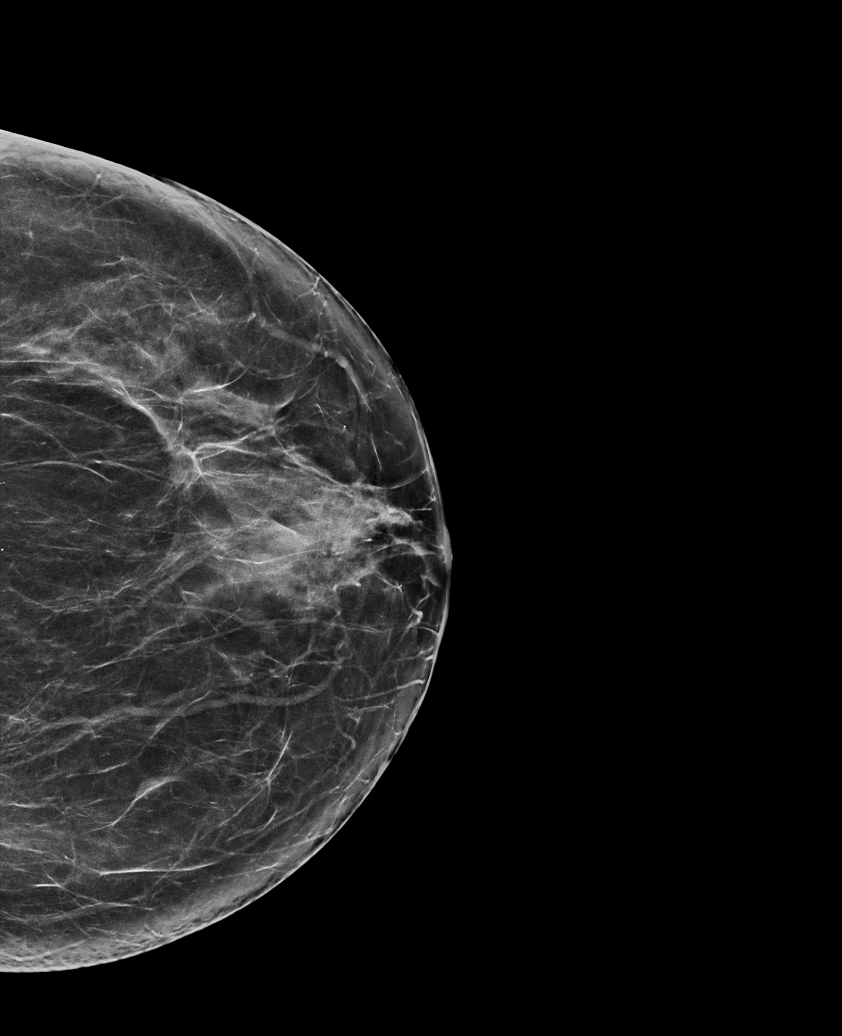

[R XCCL synth-2D]
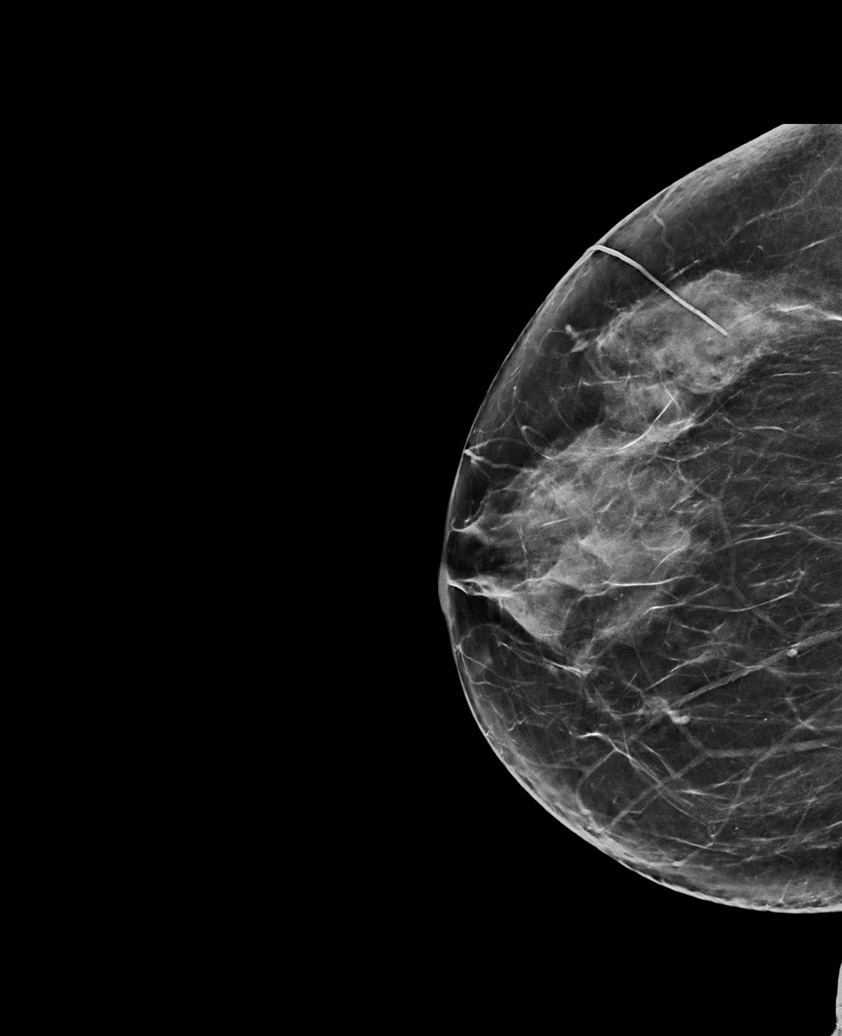

[L MLO synth-2D (1 of 2)]
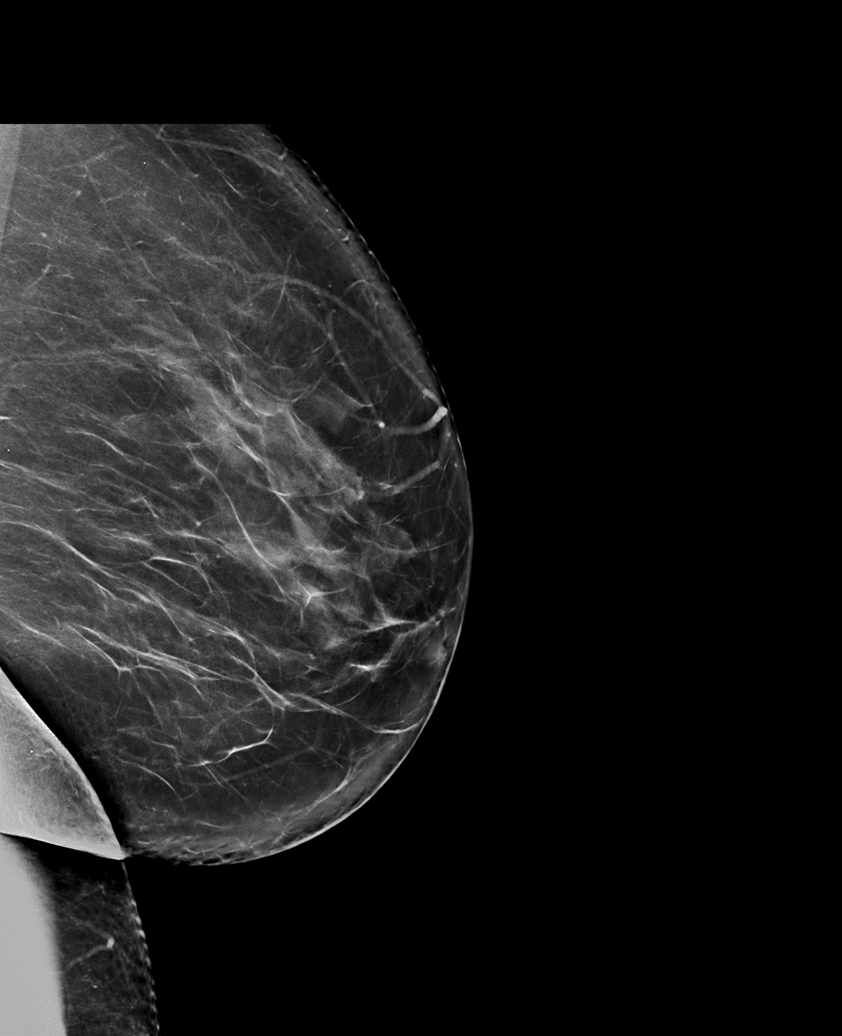

[R MLO synth-2D]
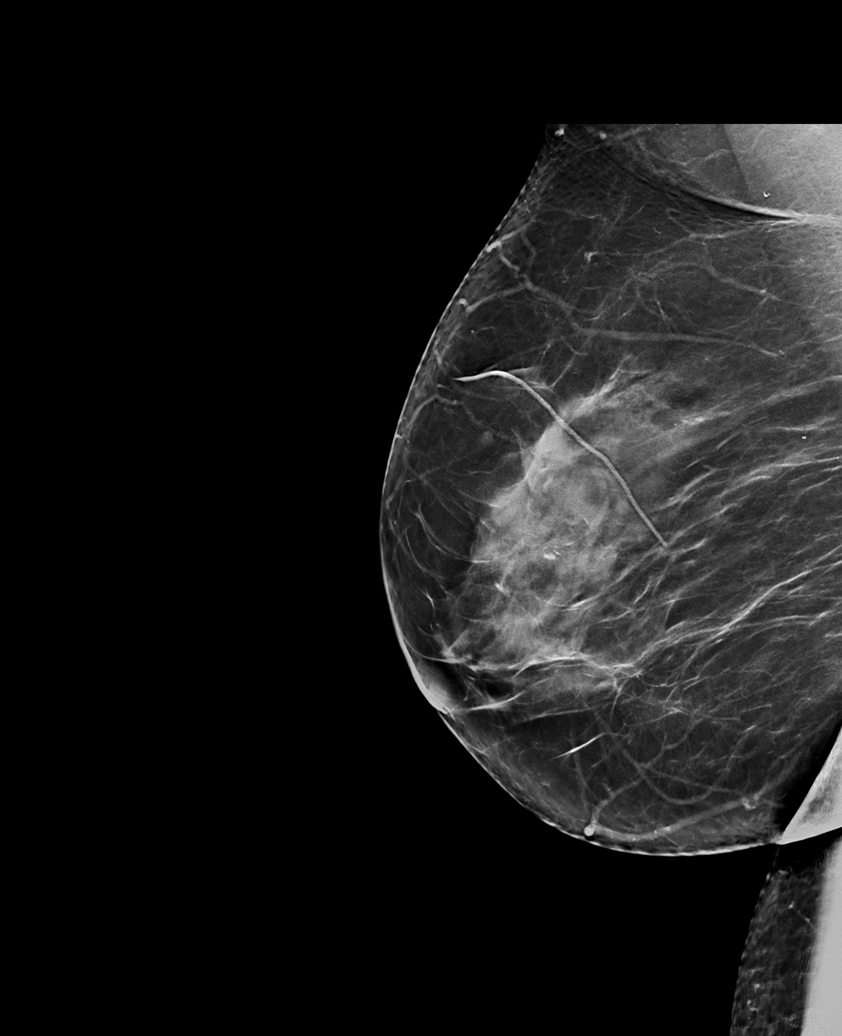

[L MLO synth-2D (2 of 2)]
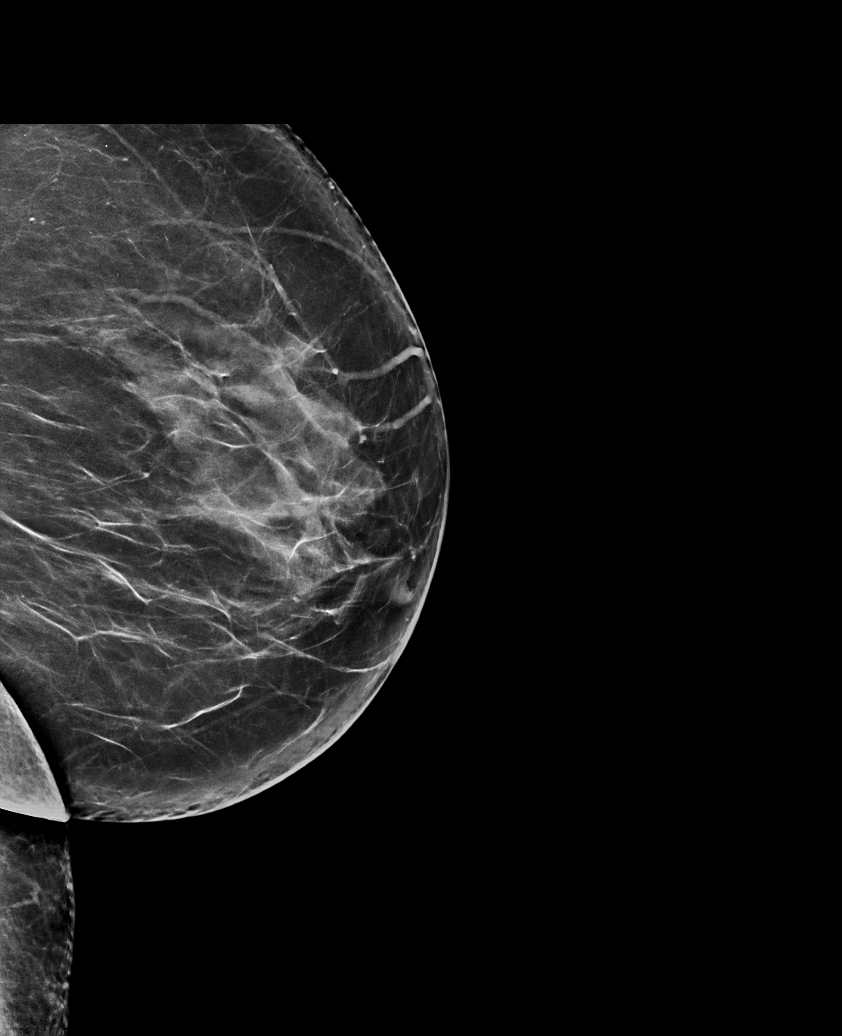

[6 of 36 positions shown; findings below may reference images not displayed]

ACR Breast Density Category c: The breast tissue is heterogeneously
dense, which may obscure small masses.
FINDINGS: There are no findings suspicious for malignancy.
IMPRESSION: No mammographic evidence of malignancy. A result letter of this
screening mammogram will be mailed directly to the patient.

RECOMMENDATION:
Screening mammogram in one year. (Code:Q3-W-BC3)

BI-RADS CATEGORY  1: Negative.

## 2022-12-18 SURGERY — COLONOSCOPY WITH PROPOFOL
Anesthesia: General

## 2022-12-18 MED ORDER — PROPOFOL 10 MG/ML IV BOLUS
INTRAVENOUS | Status: DC | PRN
  Administered 2022-12-18: 50 mg via INTRAVENOUS
  Administered 2022-12-18: 100 mg via INTRAVENOUS
  Administered 2022-12-18: 50 mg via INTRAVENOUS

## 2022-12-18 MED ORDER — CHLORHEXIDINE GLUCONATE CLOTH 2 % EX PADS: 6.0000 | MEDICATED_PAD | Freq: Once | CUTANEOUS | Status: DC

## 2022-12-18 MED ORDER — LIDOCAINE HCL (CARDIAC) PF 100 MG/5ML IV SOSY
PREFILLED_SYRINGE | INTRAVENOUS | Status: DC | PRN
  Administered 2022-12-18: 50 mg via INTRAVENOUS

## 2022-12-18 MED ORDER — LACTATED RINGERS IV SOLN: INTRAVENOUS | Status: DC | PRN

## 2022-12-18 NOTE — Op Note (Signed)
[facility] Patient Name: Penny Saunders Procedure Date: 12/18/2022 7:47 AM MRN: [id number] Date of Birth: Mar 07, 1961 Attending MD: Franky Macho , MD, [id number] CSN: [id number] Age: 62 Admit Type: Outpatient Procedure:                Colonoscopy Indications:              Screening for colorectal malignant neoplasm Providers:                Franky Macho, MD, Jannett Celestine, RN, Lennice Sites                            Technician, Technician Referring MD:              Medicines:                Propofol per Anesthesia Complications:            No immediate complications. Estimated Blood Loss:     Estimated blood loss: none. Procedure:                Pre-Anesthesia Assessment:                           - Prior to the procedure, a History and Physical                            was performed, and patient medications and                            allergies were reviewed. The patient is competent.                            The risks and benefits of the procedure and the                            sedation options and risks were discussed with the                            patient. All questions were answered and informed                            consent was obtained. Patient identification and                            proposed procedure were verified by the physician,                            the nurse, the anesthesiologist, the anesthetist                            and the technician in the endoscopy suite. Mental                            Status Examination: alert and oriented. Airway  Examination: normal oropharyngeal airway and neck                            mobility. Respiratory Examination: clear to                            auscultation. CV Examination: RRR, no murmurs, no                            S3 or S4. Prophylactic Antibiotics: The patient                            does not require prophylactic antibiotics. Prior                             Anticoagulants: The patient has taken no                            anticoagulant or antiplatelet agents except for                            aspirin. ASA Grade Assessment: II - A patient with                            mild systemic disease. After reviewing the risks                            and benefits, the patient was deemed in                            satisfactory condition to undergo the procedure.                            The anesthesia plan was to use deep sedation /                            analgesia. Immediately prior to administration of                            medications, the patient was re-assessed for                            adequacy to receive sedatives. The heart rate,                            respiratory rate, oxygen saturations, blood                            pressure, adequacy of pulmonary ventilation, and                            response to care were monitored throughout the  procedure. The physical status of the patient was                            re-assessed after the procedure.                           After obtaining informed consent, the colonoscope                            was passed under direct vision. Throughout the                            procedure, the patient's blood pressure, pulse, and                            oxygen saturations were monitored continuously. The                            [id number]) scope was introduced through the                            anus and advanced to the the cecum, identified by                            the appendiceal orifice, ileocecal valve and                            palpation. No anatomical landmarks were                            photographed. The colonoscopy was performed without                            difficulty. The patient tolerated the procedure                            well. The quality of the bowel preparation was                             adequate. The total duration of the procedure was 9                            minutes. Scope In: 7:58:17 AM Scope Out: 8:07:45 AM Scope Withdrawal Time: 0 hours 3 minutes 24 seconds  Total Procedure Duration: 0 hours 9 minutes 28 seconds  Findings:      Skin tags were found on perianal exam.      The entire examined colon appeared normal. Impression:               - Perianal skin tags found on perianal exam.                           - The entire examined colon is normal.                           -  No specimens collected. Moderate Sedation:      Moderate (conscious) sedation was administered by the nurse and       supervised by the endoscopist. The patient's oxygen saturation, heart       rate, blood pressure and response to care were monitored. Recommendation:           - Written discharge instructions were provided to                            the patient.                           - The signs and symptoms of potential delayed                            complications were discussed with the patient.                           - Patient has a contact number available for                            emergencies.                           - Return to normal activities tomorrow.                           - Resume previous diet.                           - Continue present medications.                           - Repeat colonoscopy in 10 years for screening                            purposes. Procedure Code(s):        --- Professional ---                           [phone], Colonoscopy, flexible; diagnostic, including                            collection of specimen(s) by brushing or washing,                            when performed (separate procedure) Diagnosis Code(s):        --- Professional ---                           Z12.11, Encounter for screening for malignant                            neoplasm of colon                           K64.4, Residual hemorrhoidal skin  tags CPT copyright 2022 [employer]. All  rights reserved. The codes documented in this report are preliminary and upon coder review may  be revised to meet current compliance requirements. Franky Macho, MD Franky Macho, MD 12/18/2022 8:11:34 AM This report has been signed electronically. Number of Addenda: 0

## 2022-12-18 NOTE — Interval H&P Note (Signed)
History and Physical Interval Note:  12/18/2022 7:51 AM  Penny Saunders  has presented today for surgery, with the diagnosis of SCREENING FOR COLON CANCER.  The various methods of treatment have been discussed with the patient and family. After consideration of risks, benefits and other options for treatment, the patient has consented to  Procedure(s): COLONOSCOPY WITH PROPOFOL (N/A) as a surgical intervention.  The patient's history has been reviewed, patient examined, no change in status, stable for surgery.  I have reviewed the patient's chart and labs.  Questions were answered to the patient's satisfaction.     Franky Macho

## 2022-12-18 NOTE — Transfer of Care (Signed)
Immediate Anesthesia Transfer of Care Note  Patient: Penny Saunders  Procedure(s) Performed: COLONOSCOPY WITH PROPOFOL  Patient Location: Endoscopy Unit  Anesthesia Type:General  Level of Consciousness: drowsy  Airway & Oxygen Therapy: Patient Spontanous Breathing  Post-op Assessment: Report given to RN and Post -op Vital signs reviewed and stable  Post vital signs: Reviewed and stable  Last Vitals:  Vitals Value Taken Time  BP    Temp    Pulse    Resp    SpO2      Last Pain:  Vitals:   12/18/22 0718  TempSrc: Oral  PainSc: 0-No pain      Patients Stated Pain Goal: 5 (12/18/22 0718)  Complications: No notable events documented.

## 2022-12-18 NOTE — Anesthesia Procedure Notes (Signed)
Date/Time: 12/18/2022 8:00 AM  Performed by: Julian Reil, CRNAPre-anesthesia Checklist: Patient identified, Emergency Drugs available, Suction available and Patient being monitored Patient Re-evaluated:Patient Re-evaluated prior to induction Oxygen Delivery Method: Supernova nasal CPAP Induction Type: IV induction Placement Confirmation: positive ETCO2

## 2022-12-18 NOTE — Anesthesia Preprocedure Evaluation (Signed)
Anesthesia Evaluation  Patient identified by MRN, date of birth, ID band Patient awake    Reviewed: Allergy & Precautions, H&P , NPO status , Patient's Chart, lab work & pertinent test results, reviewed documented beta blocker date and time   Airway Mallampati: II  TM Distance: >3 FB Neck ROM: full    Dental no notable dental hx.    Pulmonary neg pulmonary ROS   Pulmonary exam normal breath sounds clear to auscultation       Cardiovascular Exercise Tolerance: Good hypertension, negative cardio ROS  Rhythm:regular Rate:Normal     Neuro/Psych negative neurological ROS  negative psych ROS   GI/Hepatic negative GI ROS, Neg liver ROS,,,  Endo/Other  negative endocrine ROSdiabetes, Type 2    Renal/GU negative Renal ROS  negative genitourinary   Musculoskeletal   Abdominal   Peds  Hematology negative hematology ROS (+)   Anesthesia Other Findings   Reproductive/Obstetrics negative OB ROS                             Anesthesia Physical Anesthesia Plan  ASA: 2  Anesthesia Plan: General   Post-op Pain Management:    Induction:   PONV Risk Score and Plan: Propofol infusion  Airway Management Planned:   Additional Equipment:   Intra-op Plan:   Post-operative Plan:   Informed Consent: I have reviewed the patients History and Physical, chart, labs and discussed the procedure including the risks, benefits and alternatives for the proposed anesthesia with the patient or authorized representative who has indicated his/her understanding and acceptance.     Dental Advisory Given  Plan Discussed with: CRNA  Anesthesia Plan Comments:        Anesthesia Quick Evaluation  

## 2022-12-20 NOTE — Anesthesia Postprocedure Evaluation (Signed)
Anesthesia Post Note  Patient: Penny Saunders  Procedure(s) Performed: COLONOSCOPY WITH PROPOFOL  Patient location during evaluation: Phase II Anesthesia Type: General Level of consciousness: awake Pain management: pain level controlled Vital Signs Assessment: post-procedure vital signs reviewed and stable Respiratory status: spontaneous breathing and respiratory function stable Cardiovascular status: blood pressure returned to baseline and stable Postop Assessment: no headache and no apparent nausea or vomiting Anesthetic complications: no Comments: Late entry   No notable events documented.   Last Vitals:  Vitals:   12/18/22 0812 12/18/22 0816  BP: (!) 94/59 (!) 99/57  Pulse: 73   Resp: 19   Temp: 36.7 C   SpO2: 97%     Last Pain:  Vitals:   12/18/22 0812  TempSrc: Oral  PainSc: 0-No pain                 Windell Norfolk

## 2022-12-25 ENCOUNTER — Encounter (HOSPITAL_COMMUNITY): Payer: Self-pay | Admitting: General Surgery

## 2023-04-12 DIAGNOSIS — Z6829 Body mass index (BMI) 29.0-29.9, adult: Secondary | ICD-10-CM | POA: Diagnosis not present

## 2023-04-12 DIAGNOSIS — E7849 Other hyperlipidemia: Secondary | ICD-10-CM | POA: Diagnosis not present

## 2023-04-12 DIAGNOSIS — E782 Mixed hyperlipidemia: Secondary | ICD-10-CM | POA: Diagnosis not present

## 2023-04-12 DIAGNOSIS — E663 Overweight: Secondary | ICD-10-CM | POA: Diagnosis not present

## 2023-04-12 DIAGNOSIS — E119 Type 2 diabetes mellitus without complications: Secondary | ICD-10-CM | POA: Diagnosis not present

## 2023-04-12 DIAGNOSIS — R946 Abnormal results of thyroid function studies: Secondary | ICD-10-CM | POA: Diagnosis not present

## 2023-04-12 DIAGNOSIS — I10 Essential (primary) hypertension: Secondary | ICD-10-CM | POA: Diagnosis not present

## 2023-06-24 ENCOUNTER — Other Ambulatory Visit (HOSPITAL_COMMUNITY): Payer: Self-pay | Admitting: Family Medicine

## 2023-06-24 DIAGNOSIS — Z1231 Encounter for screening mammogram for malignant neoplasm of breast: Secondary | ICD-10-CM

## 2023-06-28 ENCOUNTER — Ambulatory Visit (HOSPITAL_COMMUNITY)
Admission: RE | Admit: 2023-06-28 | Discharge: 2023-06-28 | Disposition: A | Discharge status: Home / Self Care | Payer: BC Managed Care – PPO | Source: Ambulatory Visit | Attending: Family Medicine | Admitting: Family Medicine

## 2023-06-28 DIAGNOSIS — Z1231 Encounter for screening mammogram for malignant neoplasm of breast: Secondary | ICD-10-CM | POA: Diagnosis present

## 2024-06-26 ENCOUNTER — Other Ambulatory Visit (HOSPITAL_COMMUNITY): Payer: Self-pay

## 2024-06-26 DIAGNOSIS — Z1231 Encounter for screening mammogram for malignant neoplasm of breast: Secondary | ICD-10-CM

## 2024-07-10 ENCOUNTER — Ambulatory Visit (HOSPITAL_COMMUNITY)
Admission: RE | Admit: 2024-07-10 | Discharge: 2024-07-10 | Disposition: A | Discharge status: Home / Self Care | Source: Ambulatory Visit

## 2024-07-10 ENCOUNTER — Encounter (HOSPITAL_COMMUNITY): Payer: Self-pay

## 2024-07-10 DIAGNOSIS — Z1231 Encounter for screening mammogram for malignant neoplasm of breast: Secondary | ICD-10-CM | POA: Diagnosis present
